# Patient Record
Sex: Male | Born: 1970 | Race: Black or African American | Hispanic: No | Marital: Single | Smoking: Never smoker
Health system: Southern US, Community
[De-identification: ages and names within clinical notes are randomized; demographics above are authoritative.]

## PROBLEM LIST (undated history)

## (undated) DIAGNOSIS — I10 Essential (primary) hypertension: Secondary | ICD-10-CM

---

## 2019-02-15 ENCOUNTER — Other Ambulatory Visit: Payer: Self-pay

## 2019-02-15 ENCOUNTER — Emergency Department (HOSPITAL_COMMUNITY)
Admission: EM | Admit: 2019-02-15 | Discharge: 2019-02-16 | Disposition: A | Discharge status: Home / Self Care | Attending: Emergency Medicine | Admitting: Emergency Medicine

## 2019-02-15 ENCOUNTER — Emergency Department (HOSPITAL_COMMUNITY)

## 2019-02-15 ENCOUNTER — Encounter (HOSPITAL_COMMUNITY): Payer: Self-pay | Admitting: Emergency Medicine

## 2019-02-15 DIAGNOSIS — R079 Chest pain, unspecified: Secondary | ICD-10-CM | POA: Diagnosis present

## 2019-02-15 DIAGNOSIS — Z79899 Other long term (current) drug therapy: Secondary | ICD-10-CM | POA: Insufficient documentation

## 2019-02-15 DIAGNOSIS — I10 Essential (primary) hypertension: Secondary | ICD-10-CM | POA: Insufficient documentation

## 2019-02-15 HISTORY — DX: Essential (primary) hypertension: I10

## 2019-02-15 LAB — CBC
HCT: 51.2 % (ref 39.0–52.0)
Hemoglobin: 17.1 g/dL — ABNORMAL HIGH (ref 13.0–17.0)
MCH: 30 pg (ref 26.0–34.0)
MCHC: 33.4 g/dL (ref 30.0–36.0)
MCV: 89.8 fL (ref 80.0–100.0)
Platelets: 306 10*3/uL (ref 150–400)
RBC: 5.7 MIL/uL (ref 4.22–5.81)
RDW: 12.2 % (ref 11.5–15.5)
WBC: 7.5 10*3/uL (ref 4.0–10.5)
nRBC: 0 % (ref 0.0–0.2)

## 2019-02-15 LAB — TROPONIN I (HIGH SENSITIVITY): Troponin I (High Sensitivity): 7 ng/L (ref <18)

## 2019-02-15 LAB — BASIC METABOLIC PANEL
Anion gap: 11 (ref 5–15)
BUN: 15 mg/dL (ref 6–20)
CO2: 28 mmol/L (ref 22–32)
Calcium: 9.2 mg/dL (ref 8.9–10.3)
Chloride: 97 mmol/L — ABNORMAL LOW (ref 98–111)
Creatinine, Ser: 1.24 mg/dL (ref 0.61–1.24)
GFR calc Af Amer: >60 mL/min (ref >60)
GFR calc non Af Amer: >60 mL/min (ref >60)
Glucose, Bld: 108 mg/dL — ABNORMAL HIGH (ref 70–99)
Potassium: 3.6 mmol/L (ref 3.5–5.1)
Sodium: 136 mmol/L (ref 135–145)

## 2019-02-15 MED ORDER — ACETAMINOPHEN 500 MG PO TABS
1000.0000 mg | ORAL_TABLET | Freq: Once | ORAL | Status: AC
  Administered 2019-02-16: 1000 mg via ORAL
  Filled 2019-02-15: qty 2

## 2019-02-15 MED ORDER — CLONIDINE HCL 0.1 MG PO TABS
0.1000 mg | ORAL_TABLET | Freq: Once | ORAL | Status: AC
  Administered 2019-02-16: 0.1 mg via ORAL
  Filled 2019-02-15: qty 1

## 2019-02-15 NOTE — ED Triage Notes (Signed)
Pt from [facility]. Pt c/o chest pain, HTN, and loss of appetite x 1 week.

## 2019-02-15 NOTE — ED Notes (Signed)
Two attempts for an IV failed. Charge nurse notified.

## 2019-02-16 LAB — TROPONIN I (HIGH SENSITIVITY): Troponin I (High Sensitivity): 7 ng/L (ref <18)

## 2019-02-16 NOTE — Discharge Instructions (Addendum)
Your blood pressure needs to be managed closely by your medical staff.  They might increase the dose of your amlodipine or consider adding Cardizem or a beta-blocker.  If your blood pressure remains difficult to control you may need testing done to make sure you do not have a problem called renal artery stenosis.  That is not done in the emergency department.  You may need referral to a cardiologist if your blood pressure remains difficult to control for advice.  Fortunately your kidney function is good tonight.  You are not having a heart attack.

## 2019-02-16 NOTE — ED Provider Notes (Signed)
[facility] EMERGENCY DEPARTMENT Provider Note   CSN: [id number] Arrival date & time: 02/15/19  2133   Time seen 11:25 PM  History Chief Complaint  Patient presents with  . Chest Pain    Jesse Bryant is a 49 y.o. male.  HPI Patient states he is never been treated for hypertension before.  He states a couple months ago he was started on amlodipine 10 mg and most recently they added HCTZ 25 mg.  He states 2 to 3 weeks ago he started having sharp left lower chest pains that would last 20 to 30 seconds.  They would hurt more with deep breaths and feel better if laying still.  He states sometimes he gets a cramping pain in his chest "locks up, sometimes the pain is elsewhere and he gets "locked up".  He states he has shortness of breath with the pain and he has a chronic cough because he has asthma.  He states he wheezes daily and uses his inhalers.  He denies any fevers.  He states yesterday he felt like his body got overheated and he had nausea and vomiting.  He states he has not eaten well in about 6 days because he has decreased appetite.  He denies diarrhea, sore throat, but states he gets some rhinorrhea when he coughs.  He states he has holocranial headaches daily that he wakes up with and he states they started when his blood pressure started getting high.  He states his blood pressure has been in the 1 80-1 90 range for the past few months.  He states he has been having trouble reading recently.  He currently denies nausea now but states he still has some mild headache.  He states hypertension and diabetes runs in his family.  Patient was started on amoxicillin today by the medical personnel at [facility] prison.  He states he thinks it was for his asthma.  PCP Patient, No Pcp Per     Past Medical History:  Diagnosis Date  . Hypertension     There are no problems to display for this patient.    The histories are not reviewed yet. Please review them in the "History" navigator section  and refresh this SmartLink.     No family history on file.  Social History   Tobacco Use  . Smoking status: Never Smoker  . Smokeless tobacco: Never Used  Substance Use Topics  . Alcohol use: Not Currently  . Drug use: Not Currently  Patient currently incarcerated  Home Medications Prior to Admission medications   Medication Sig Start Date End Date Taking? Authorizing Provider  albuterol (VENTOLIN HFA) 108 (90 Base) MCG/ACT inhaler Inhale 2 puffs into the lungs every 4 (four) hours as needed for wheezing or shortness of breath (As needed for shortness of breath and wheezing).   Yes [provider]  amLODipine (NORVASC) 10 MG tablet Take 10 mg by mouth at bedtime.   Yes [provider]  amoxicillin (AMOXIL) 500 MG capsule Take 500 mg by mouth 3 (three) times daily.   Yes [provider]  guaiFENesin (MUCINEX) 600 MG 12 hr tablet Take by mouth 2 (two) times daily.   Yes [provider]  hydrochlorothiazide (HYDRODIURIL) 25 MG tablet Take 25 mg by mouth daily.   Yes [provider]  mometasone-formoterol (DULERA) 100-5 MCG/ACT AERO Inhale 2 puffs into the lungs 2 (two) times daily.   Yes [provider]    Allergies    Shellfish allergy  Review  of Systems   Review of Systems  All other systems reviewed and are negative.   Physical Exam Updated Vital Signs BP (!) 138/97   Pulse 68   Temp 98.7 F (37.1 C)   Resp 19   Ht 6\' 1"  (1.854 m)   Wt 117.9 kg   SpO2 97%   BMI 34.30 kg/m   Physical Exam Constitutional:      General: He is not in acute distress.    Appearance: Normal appearance. He is obese.  HENT:     Head: Normocephalic and atraumatic.     Right Ear: External ear normal.     Left Ear: External ear normal.  Eyes:     Extraocular Movements: Extraocular movements intact.     Conjunctiva/sclera: Conjunctivae normal.     Pupils: Pupils are equal, round, and reactive to light.  Cardiovascular:     Rate and  Rhythm: Normal rate and regular rhythm.     Pulses: Normal pulses.     Heart sounds: No murmur.  Pulmonary:     Effort: Pulmonary effort is normal.     Breath sounds: Normal breath sounds.     Comments: Patient had a few wheezes at the bases however he coughed and they went away Musculoskeletal:        General: Normal range of motion.     Cervical back: Normal range of motion.     Right lower leg: No edema.     Left lower leg: No edema.  Skin:    General: Skin is warm and dry.  Neurological:     General: No focal deficit present.     Mental Status: He is alert and oriented to person, place, and time.     Cranial Nerves: No cranial nerve deficit.  Psychiatric:        Mood and Affect: Mood normal.        Behavior: Behavior normal.        Thought Content: Thought content normal.     ED Results / Procedures / Treatments   Labs (all labs ordered are listed, but only abnormal results are displayed) Results for orders placed or performed during the hospital encounter of 02/15/19  Basic metabolic panel  Result Value Ref Range   Sodium 136 135 - 145 mmol/L   Potassium 3.6 3.5 - 5.1 mmol/L   Chloride 97 (L) 98 - 111 mmol/L   CO2 28 22 - 32 mmol/L   Glucose, Bld 108 (H) 70 - 99 mg/dL   BUN 15 6 - 20 mg/dL   Creatinine, Ser 04/15/19 0.61 - 1.24 mg/dL   Calcium 9.2 8.9 - 6.28 mg/dL   GFR calc non Af Amer >60 >60 mL/min   GFR calc Af Amer >60 >60 mL/min   Anion gap 11 5 - 15  CBC  Result Value Ref Range   WBC 7.5 4.0 - 10.5 K/uL   RBC 5.70 4.22 - 5.81 MIL/uL   Hemoglobin 17.1 (H) 13.0 - 17.0 g/dL   HCT 31.5 17.6 - 16.0 %   MCV 89.8 80.0 - 100.0 fL   MCH 30.0 26.0 - 34.0 pg   MCHC 33.4 30.0 - 36.0 g/dL   RDW 73.7 10.6 - 26.9 %   Platelets 306 150 - 400 K/uL   nRBC 0.0 0.0 - 0.2 %  Troponin I (High Sensitivity)  Result Value Ref Range   Troponin I (High Sensitivity) 7 <18 ng/L  Troponin I (High Sensitivity)  Result Value Ref Range  Troponin I (High Sensitivity) 7 <18 ng/L    Laboratory interpretation all normal except nonfasting hyperglycemia, negative delta troponin    EKG EKG Interpretation  Date/Time:  Monday February 15 2019 21:51:31 EST Ventricular Rate:  75 PR Interval:    QRS Duration: 105 QT Interval:  399 QTC Calculation: 446 R Axis:   57 Text Interpretation: Sinus rhythm Borderline repolarization abnormality Confirmed by Milton Ferguson [id number]) on 02/15/2019 9:57:02 PM   Radiology DG Chest Portable 1 View  Result Date: 02/15/2019 CLINICAL DATA:  Chest pain, hypertension, anorexia for 1 week EXAM: PORTABLE CHEST 1 VIEW COMPARISON:  None. FINDINGS: The heart size and mediastinal contours are within normal limits. Both lungs are clear. The visualized skeletal structures are unremarkable. IMPRESSION: No active disease. Electronically Signed   By: Randa Ngo M.D.   On: 02/15/2019 22:42    Procedures Procedures (including critical care time)  Medications Ordered in ED Medications  cloNIDine (CATAPRES) tablet 0.1 mg (0.1 mg Oral Given 02/16/19 0017)  acetaminophen (TYLENOL) tablet 1,000 mg (1,000 mg Oral Given 02/16/19 0017)    ED Course  I have reviewed the triage vital signs and the nursing notes.  Pertinent labs & imaging results that were available during my care of the patient were reviewed by me and considered in my medical decision making (see chart for details).    MDM Rules/Calculators/A&P                      Patient was given clonidine orally for his hypertension.  He was given acetaminophen for his complaints of headache.  We discussed his laboratory results, at the time of my exam we only have the first troponin.  Second troponin will be drawn and an hour after I saw the patient.  Patient was given clonidine for his blood pressure without much change.  His delta troponin is negative.  He can be released back to his facility.  He needs them to manage his blood pressure better, he may need a consult with an internist or  cardiologist, he may need testing to see if he has a renal artery stenosis.  At time of discharge his blood pressure did improve to 138/97.  This was approximately 2 hours after the clonidine.  Final Clinical Impression(s) / ED Diagnoses Final diagnoses:  Essential hypertension    Rx / DC Orders ED Discharge Orders    None     Plan discharge  Rolland Porter, MD, Barbette Or, MD 02/16/19 [phone]

## 2019-02-16 NOTE — ED Notes (Signed)
Report given to Darin Engels, Charity fundraiser. Officer provided phone number to nurse to give report before discharge.   Phone number: [phone]

## 2019-06-13 DIAGNOSIS — Y92143 Cell of prison as the place of occurrence of the external cause: Secondary | ICD-10-CM | POA: Insufficient documentation

## 2019-06-13 DIAGNOSIS — Z79899 Other long term (current) drug therapy: Secondary | ICD-10-CM | POA: Diagnosis not present

## 2019-06-13 DIAGNOSIS — Y939 Activity, unspecified: Secondary | ICD-10-CM | POA: Insufficient documentation

## 2019-06-13 DIAGNOSIS — W228XXA Striking against or struck by other objects, initial encounter: Secondary | ICD-10-CM | POA: Insufficient documentation

## 2019-06-13 DIAGNOSIS — I10 Essential (primary) hypertension: Secondary | ICD-10-CM | POA: Diagnosis not present

## 2019-06-13 DIAGNOSIS — Z91013 Allergy to seafood: Secondary | ICD-10-CM | POA: Insufficient documentation

## 2019-06-13 DIAGNOSIS — Y999 Unspecified external cause status: Secondary | ICD-10-CM | POA: Diagnosis not present

## 2019-06-13 DIAGNOSIS — S81011A Laceration without foreign body, right knee, initial encounter: Secondary | ICD-10-CM | POA: Insufficient documentation

## 2019-06-14 ENCOUNTER — Encounter (HOSPITAL_COMMUNITY): Payer: Self-pay | Admitting: Emergency Medicine

## 2019-06-14 ENCOUNTER — Other Ambulatory Visit: Payer: Self-pay

## 2019-06-14 ENCOUNTER — Emergency Department (HOSPITAL_COMMUNITY)
Admission: EM | Admit: 2019-06-14 | Discharge: 2019-06-14 | Attending: Emergency Medicine | Admitting: Emergency Medicine

## 2019-06-14 ENCOUNTER — Emergency Department (HOSPITAL_COMMUNITY)

## 2019-06-14 DIAGNOSIS — S81811A Laceration without foreign body, right lower leg, initial encounter: Secondary | ICD-10-CM

## 2019-06-14 DIAGNOSIS — I10 Essential (primary) hypertension: Secondary | ICD-10-CM

## 2019-06-14 MED ORDER — LIDOCAINE-EPINEPHRINE (PF) 2 %-1:200000 IJ SOLN
10.0000 mL | Freq: Once | INTRAMUSCULAR | Status: AC
  Administered 2019-06-14: 10 mL
  Filled 2019-06-14: qty 10

## 2019-06-14 NOTE — ED Notes (Signed)
Report giving Darin Engels, Charity fundraiser.

## 2019-06-14 NOTE — Discharge Instructions (Signed)
Sutures need to be removed in 10 days.  °

## 2019-06-14 NOTE — ED Provider Notes (Signed)
[facility] EMERGENCY DEPARTMENT Provider Note   CSN: [id number] Arrival date & time: 06/13/19  2340   History Chief Complaint  Patient presents with  . Laceration    Jesse Bryant is a 49 y.o. male.  The history is provided by the patient.  Laceration He has history of hypertension and injured his right knee when he hit it against the side of a bunk.  He suffered a laceration.  Is up-to-date on tetanus immunizations.  He denies other injury.  Past Medical History:  Diagnosis Date  . Hypertension     There are no problems to display for this patient.   History reviewed. No pertinent surgical history.     No family history on file.  Social History   Tobacco Use  . Smoking status: Never Smoker  . Smokeless tobacco: Never Used  Substance Use Topics  . Alcohol use: Not Currently  . Drug use: Not Currently    Home Medications Prior to Admission medications   Medication Sig Start Date End Date Taking? Authorizing Provider  albuterol (VENTOLIN HFA) 108 (90 Base) MCG/ACT inhaler Inhale 2 puffs into the lungs every 4 (four) hours as needed for wheezing or shortness of breath (As needed for shortness of breath and wheezing).    [provider]  amLODipine (NORVASC) 10 MG tablet Take 10 mg by mouth at bedtime.    [provider]  amoxicillin (AMOXIL) 500 MG capsule Take 500 mg by mouth 3 (three) times daily.    [provider]  guaiFENesin (MUCINEX) 600 MG 12 hr tablet Take by mouth 2 (two) times daily.    [provider]  hydrochlorothiazide (HYDRODIURIL) 25 MG tablet Take 25 mg by mouth daily.    [provider]  mometasone-formoterol (DULERA) 100-5 MCG/ACT AERO Inhale 2 puffs into the lungs 2 (two) times daily.    [provider]    Allergies    Shellfish allergy  Review of Systems   Review of Systems  All other systems reviewed and are negative.   Physical Exam Updated Vital Signs BP (!) 141/92   Pulse  60   Temp 98.2 F (36.8 C)   Resp 18   Ht 6\' 1"  (1.854 m)   Wt 117.9 kg   SpO2 100%   BMI 34.30 kg/m   Physical Exam Vitals and nursing note reviewed.   49 year old male, resting comfortably and in no acute distress. Vital signs are significant for mildly elevated blood pressure. Oxygen saturation is 100%, which is normal. Head is normocephalic and atraumatic. PERRLA, EOMI. Oropharynx is clear. Neck is nontender and supple without adenopathy or JVD. Back is nontender and there is no CVA tenderness. Lungs are clear without rales, wheezes, or rhonchi. Chest is nontender. Heart has regular rate and rhythm without murmur. Abdomen is soft, flat, nontender without masses or hepatosplenomegaly and peristalsis is normoactive. Extremities: Laceration is present on the proximal right lower leg just distal to the knee, oriented transversely.  Distal neurovascular exam is intact. Skin is warm and dry without rash. Neurologic: Mental status is normal, cranial nerves are intact, there are no motor or sensory deficits.   ED Results / Procedures / Treatments    Procedures .49Laceration Repair  Date/Time: 06/14/2019 4:23 AM Performed by: 08/14/2019, MD Authorized by: Dione Booze, MD   Consent:    Consent obtained:  Verbal   Consent given by:  Patient   Risks discussed:  Infection and pain   Alternatives discussed:  No treatment  Anesthesia (see MAR for exact dosages):    Anesthesia method:  Local infiltration   Local anesthetic:  Lidocaine 2% WITH epi Laceration details:    Location:  Leg   Leg location:  R lower leg   Length (cm):  8   Depth (mm):  4 Repair type:    Repair type:  Simple Pre-procedure details:    Preparation:  Patient was prepped and draped in usual sterile fashion and imaging obtained to evaluate for foreign bodies Exploration:    Hemostasis achieved with:  Direct pressure   Wound exploration: entire depth of wound probed and visualized     Wound extent: no  fascia violation noted, no foreign bodies/material noted, no tendon damage noted and no underlying fracture noted     Contaminated: no   Treatment:    Area cleansed with:  Saline   Amount of cleaning:  Standard Skin repair:    Repair method:  Sutures   Suture size:  4-0   Suture material:  Prolene   Suture technique:  Running Approximation:    Approximation:  Close Post-procedure details:    Dressing:  Antibiotic ointment and sterile dressing   Patient tolerance of procedure:  Tolerated well, no immediate complications Comments:     Marked tenderness noted during wound exploration, will send for x-ray to rule out underlying fracture.    Imaging Results DG Knee Complete 4 Views Right  Result Date: 06/14/2019 CLINICAL DATA:  Blunt trauma.  Laceration EXAM: RIGHT KNEE - COMPLETE 4+ VIEW COMPARISON:  None. FINDINGS: No evidence of fracture, dislocation, or joint effusion. No opaque foreign body. IMPRESSION: Negative for fracture or opaque foreign body. Electronically Signed   By: Monte Fantasia M.D.   On: 06/14/2019 05:25    Medications Ordered in ED Medications  lidocaine-EPINEPHrine (XYLOCAINE W/EPI) 2 %-1:200000 (PF) injection 10 mL (10 mLs Infiltration Given by Other 06/14/19 1027)    ED Course  I have reviewed the triage vital signs and the nursing notes.  MDM Rules/Calculators/A&P Laceration of the right lower leg, closed with sutures.  Mildly elevated blood pressure.  Old records are reviewed, and he has no relevant past visits.  He is discharged with instructions to have the sutures removed in 7-10 days.  Final Clinical Impression(s) / ED Diagnoses Final diagnoses:  None    Rx / DC Orders ED Discharge Orders    None       Delora Fuel, MD 25/36/64 [phone]

## 2019-06-14 NOTE — ED Triage Notes (Signed)
Pt from [facility]. States he fell and cut his right knee on the corner of the bed. Dressing dry and intact at this time.

## 2019-07-19 ENCOUNTER — Emergency Department (HOSPITAL_COMMUNITY)

## 2019-07-19 ENCOUNTER — Encounter (HOSPITAL_COMMUNITY): Payer: Self-pay | Admitting: Emergency Medicine

## 2019-07-19 ENCOUNTER — Emergency Department (HOSPITAL_COMMUNITY)
Admission: EM | Admit: 2019-07-19 | Discharge: 2019-07-19 | Disposition: A | Discharge status: Home / Self Care | Attending: Emergency Medicine | Admitting: Emergency Medicine

## 2019-07-19 DIAGNOSIS — R0602 Shortness of breath: Secondary | ICD-10-CM | POA: Insufficient documentation

## 2019-07-19 DIAGNOSIS — I10 Essential (primary) hypertension: Secondary | ICD-10-CM | POA: Diagnosis present

## 2019-07-19 LAB — TROPONIN I (HIGH SENSITIVITY): Troponin I (High Sensitivity): 4 ng/L (ref <18)

## 2019-07-19 LAB — BASIC METABOLIC PANEL
Anion gap: 12 (ref 5–15)
BUN: 12 mg/dL (ref 6–20)
CO2: 23 mmol/L (ref 22–32)
Calcium: 9.4 mg/dL (ref 8.9–10.3)
Chloride: 102 mmol/L (ref 98–111)
Creatinine, Ser: 1.21 mg/dL (ref 0.61–1.24)
GFR calc Af Amer: >60 mL/min (ref >60)
GFR calc non Af Amer: >60 mL/min (ref >60)
Glucose, Bld: 113 mg/dL — ABNORMAL HIGH (ref 70–99)
Potassium: 3.8 mmol/L (ref 3.5–5.1)
Sodium: 137 mmol/L (ref 135–145)

## 2019-07-19 LAB — CBC
HCT: 49 % (ref 39.0–52.0)
Hemoglobin: 16.1 g/dL (ref 13.0–17.0)
MCH: 30.2 pg (ref 26.0–34.0)
MCHC: 32.9 g/dL (ref 30.0–36.0)
MCV: 91.9 fL (ref 80.0–100.0)
Platelets: 332 10*3/uL (ref 150–400)
RBC: 5.33 MIL/uL (ref 4.22–5.81)
RDW: 12.9 % (ref 11.5–15.5)
WBC: 6.7 10*3/uL (ref 4.0–10.5)
nRBC: 0 % (ref 0.0–0.2)

## 2019-07-19 NOTE — Discharge Instructions (Signed)
Your labwork and imaging were reassuring today. Your symptoms are unfortunately like related to the lack of AC at the facility. No medical intervention is required today.   Continue taking your blood pressure medication as prescribed.

## 2019-07-19 NOTE — ED Triage Notes (Signed)
States he has high blood pressure has been having trouble breathing and states he feels his hands lock up and he does not feel well.

## 2019-07-19 NOTE — ED Provider Notes (Signed)
[name] EMERGENCY DEPARTMENT Provider Note   CSN: [id number] Arrival date & time: 07/19/19  9622     History No chief complaint on file.   Jesse Bryant is a 49 y.o. male with PMHx HTN who presents to the ED today with CC HTN.  Per triage report patient states he has high blood pressure and has been having trouble breathing states he feels like his hands lock up and he does not feel well.  While in the room patient is mostly focused on his shortness of breath.  [facility] and states he has been there for approximately 3 years.  He states they do not have any air conditioning there and every time 8 gets warm outside he begins feeling short of breath.  He states he has a history of sleep apnea however he denies ever being evaluated for this.  He states when he was at home prior to being in prison he would use his mother's O2 as needed when the air got "hot."  Patient states since summer rolled around he has been having returning symptoms.  He states he needs to be somewhere where they have air conditioning and is here to be evaluated.  Patient does also report he has a history of asthma and has been using his albuterol inhalers when he feels short of breath however this has not been helping him.   Patient also reports that he does have high blood pressure and is currently on medication however he cannot tell me the name.  Per chart review it appears patient is on 10 mg of amlodipine and 25 HCTZ.  He states that he is given this medication every day at the present however they do not check his blood pressure as regularly as he would like.   Patient denies fevers, chills, chest pain, abdominal pain, nausea, vomiting, diarrhea, headache, vision changes, any other associated symptoms.   The history is provided by the patient and medical records.       Past Medical History:  Diagnosis Date  . Hypertension     There are no problems to display for this  patient.   History reviewed. No pertinent surgical history.     No family history on file.  Social History   Tobacco Use  . Smoking status: Never Smoker  . Smokeless tobacco: Never Used  Substance Use Topics  . Alcohol use: Not Currently  . Drug use: Not Currently    Home Medications Prior to Admission medications   Medication Sig Start Date End Date Taking? Authorizing Provider  albuterol (VENTOLIN HFA) 108 (90 Base) MCG/ACT inhaler Inhale 2 puffs into the lungs every 4 (four) hours as needed for wheezing or shortness of breath (As needed for shortness of breath and wheezing).    [provider]  amLODipine (NORVASC) 10 MG tablet Take 10 mg by mouth at bedtime.    [provider]  amoxicillin (AMOXIL) 500 MG capsule Take 500 mg by mouth 3 (three) times daily.    [provider]  guaiFENesin (MUCINEX) 600 MG 12 hr tablet Take by mouth 2 (two) times daily.    [provider]  hydrochlorothiazide (HYDRODIURIL) 25 MG tablet Take 25 mg by mouth daily.    [provider]  mometasone-formoterol (DULERA) 100-5 MCG/ACT AERO Inhale 2 puffs into the lungs 2 (two) times daily.    [provider]    Allergies    Shellfish allergy  Review of Systems  Review of Systems  Constitutional: Negative for chills and fever.  Eyes: Negative for visual disturbance.  Respiratory: Positive for shortness of breath. Negative for cough.   Cardiovascular: Negative for chest pain.  Gastrointestinal: Negative for abdominal pain, nausea and vomiting.  Neurological: Negative for headaches.  All other systems reviewed and are negative.   Physical Exam Updated Vital Signs BP (!) 154/104   Pulse (!) 55   Temp 98.3 F (36.8 C) (Oral)   Resp 18   Ht 6\' 1"  (1.854 m)   Wt 115.7 kg   SpO2 99%   BMI 33.64 kg/m   Physical Exam Vitals and nursing note reviewed.  Constitutional:      Appearance: He is not ill-appearing or diaphoretic.      Comments: Standing upright coughing in a corner when I enter the room  HENT:     Head: Normocephalic and atraumatic.  Eyes:     Conjunctiva/sclera: Conjunctivae normal.  Cardiovascular:     Rate and Rhythm: Normal rate and regular rhythm.  Pulmonary:     Effort: Pulmonary effort is normal.     Breath sounds: Normal breath sounds. No wheezing, rhonchi or rales.     Comments: Speaking rapidly in complete sentences. No accessory muscle use. Satting 99% on RA. LCTAB.  Abdominal:     Palpations: Abdomen is soft.     Tenderness: There is no abdominal tenderness. There is no guarding or rebound.  Musculoskeletal:     Cervical back: Neck supple.  Skin:    General: Skin is warm and dry.  Neurological:     Mental Status: He is alert.  Psychiatric:     Comments: Pressured speech     ED Results / Procedures / Treatments   Labs (all labs ordered are listed, but only abnormal results are displayed) Labs Reviewed  BASIC METABOLIC PANEL - Abnormal; Notable for the following components:      Result Value   Glucose, Bld 113 (*)    All other components within normal limits  CBC  TROPONIN I (HIGH SENSITIVITY)  TROPONIN I (HIGH SENSITIVITY)    EKG EKG Interpretation  Date/Time:  Monday July 19 2019 10:24:35 EDT Ventricular Rate:  70 PR Interval:  156 QRS Duration: 88 QT Interval:  408 QTC Calculation: 440 R Axis:   61 Text Interpretation: Normal sinus rhythm T wave abnormality, consider inferior ischemia Abnormal ECG similar to prior tracing from 02/15/19 Confirmed by 04/15/19 [id number]) on 07/19/2019 12:14:49 PM   Radiology DG Chest 2 View  Result Date: 07/19/2019 CLINICAL DATA:  Chest pressure. Additional provided: High blood pressure, difficulty breathing, history of asthma EXAM: CHEST - 2 VIEW COMPARISON:  Chest radiograph 02/15/2019 FINDINGS: Heart size within normal limits. There is no appreciable airspace consolidation. No evidence of pleural effusion or pneumothorax. No acute  bony abnormality identified. IMPRESSION: No evidence of active cardiopulmonary disease. Electronically Signed   By: 04/15/2019 DO   On: 07/19/2019 10:43    Procedures Procedures (including critical care time)  Medications Ordered in ED Medications - No data to display  ED Course  I have reviewed the triage vital signs and the nursing notes.  Pertinent labs & imaging results that were available during my care of the patient were reviewed by me and considered in my medical decision making (see chart for details).    MDM Rules/Calculators/A&P  49 year old male who resides at the present can presenting to the ED today complaining of shortness of breath that occurs whenever summer comes as there is no AC at the facility.  He also complains of high blood pressure.  He has history of same and is currently on amlodipine and HCTZ.  As I enter the room patient is standing in a corner coughing sporadically.  He then comes to sit down on the bed and does not continue to cough.  Patient is noted to have pressured speech.  He is speaking very rapidly in complete sentences and there is no increased work of breathing or accessory muscle use noted.  Patient's lungs are clear to auscultation bilaterally.  He is satting 99% on room air.  He is afebrile, nontachycardic nontachypneic.  He appears to be in no acute distress at this time.  He does have albuterol inhaler with him as he reports history of asthma.  Blood pressure 154/104 on arrival.  Lab work was obtained while patient was in the waiting room including EKG, chest x-ray, work-up for ACS however patient denies any chest pain to me.   Chest x-ray clear. CBC without leukocytosis.  Hemoglobin stable at 16.1. BMP with mild elevation in glucose at 113. No other findings.  Troponin of 4. Pt without specific complaints of CP. I do not feel he needs a repeat troponin at this time.   Pt ambulated in the ED with O2 sats remaining at  99%.  Orthostatics within normal limits.   Will discharge at this time. Pt continues to express he will feel short of breath during the summer months due to lack of AC at his facility. I do not feel there is any need for further emergent workup at this time.   This note was prepared using Dragon voice recognition software and may include unintentional dictation errors due to the inherent limitations of voice recognition software.  Final Clinical Impression(s) / ED Diagnoses Final diagnoses:  Mild shortness of breath    Rx / DC Orders ED Discharge Orders    None       Discharge Instructions     Your labwork and imaging were reassuring today. Your symptoms are unfortunately like related to the lack of AC at the facility. No medical intervention is required today.   Continue taking your blood pressure medication as prescribed.        Tanda Rockers, PA-C 07/19/19 1328    Raeford Razor, MD 07/25/19 1126

## 2019-07-19 NOTE — ED Notes (Signed)
Pulse ox 99=100 while ambulating

## 2021-02-09 IMAGING — DX DG CHEST 1V PORT
1 series · 1 of 1 positions shown · non-contrast
Comparison: None.

CLINICAL DATA: Chest pain, hypertension, anorexia for 1 week

EXAM:
PORTABLE CHEST 1 VIEW

[chest ap grid]
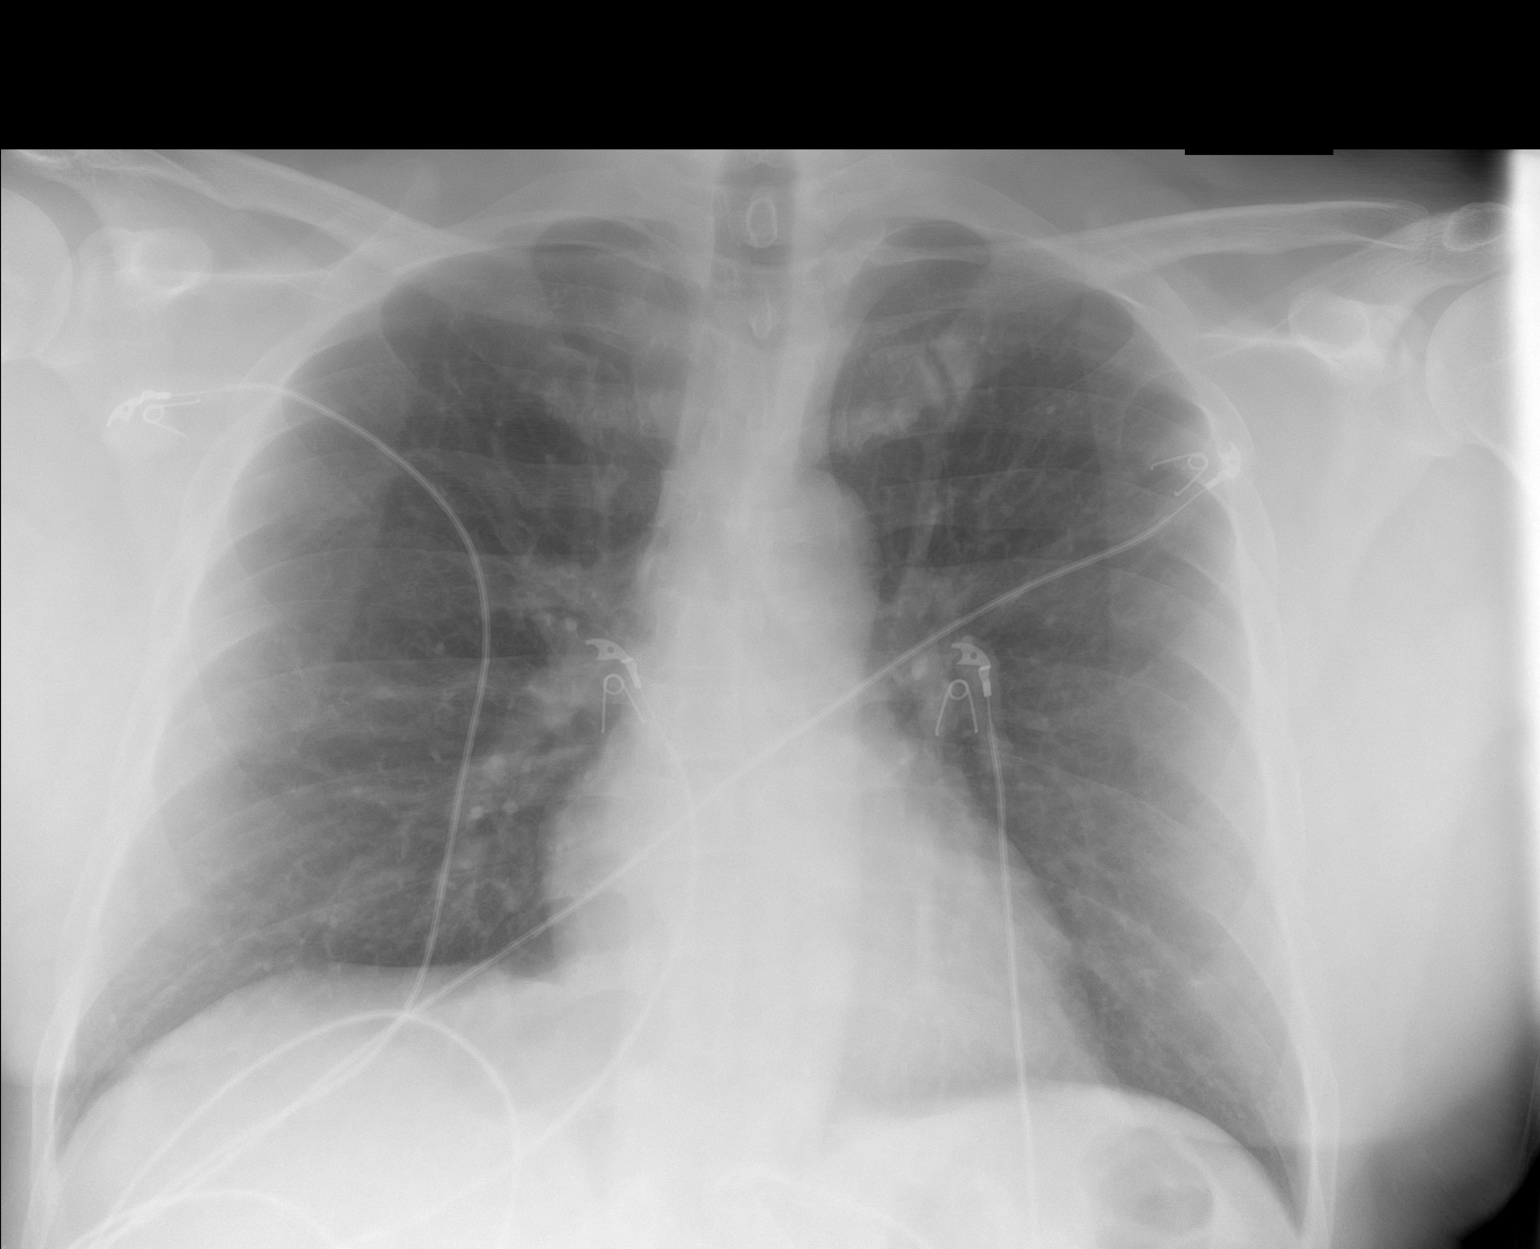

[1 of 1 positions shown; findings below may reference images not displayed]

FINDINGS: The heart size and mediastinal contours are within normal limits.
Both lungs are clear. The visualized skeletal structures are
unremarkable.
IMPRESSION: No active disease.

## 2021-06-08 IMAGING — DX DG KNEE COMPLETE 4+V*R*
4 series · 4 of 4 positions shown · non-contrast
Comparison: None.

CLINICAL DATA: Blunt trauma.  Laceration

EXAM:
RIGHT KNEE - COMPLETE 4+ VIEW

[knee ap]
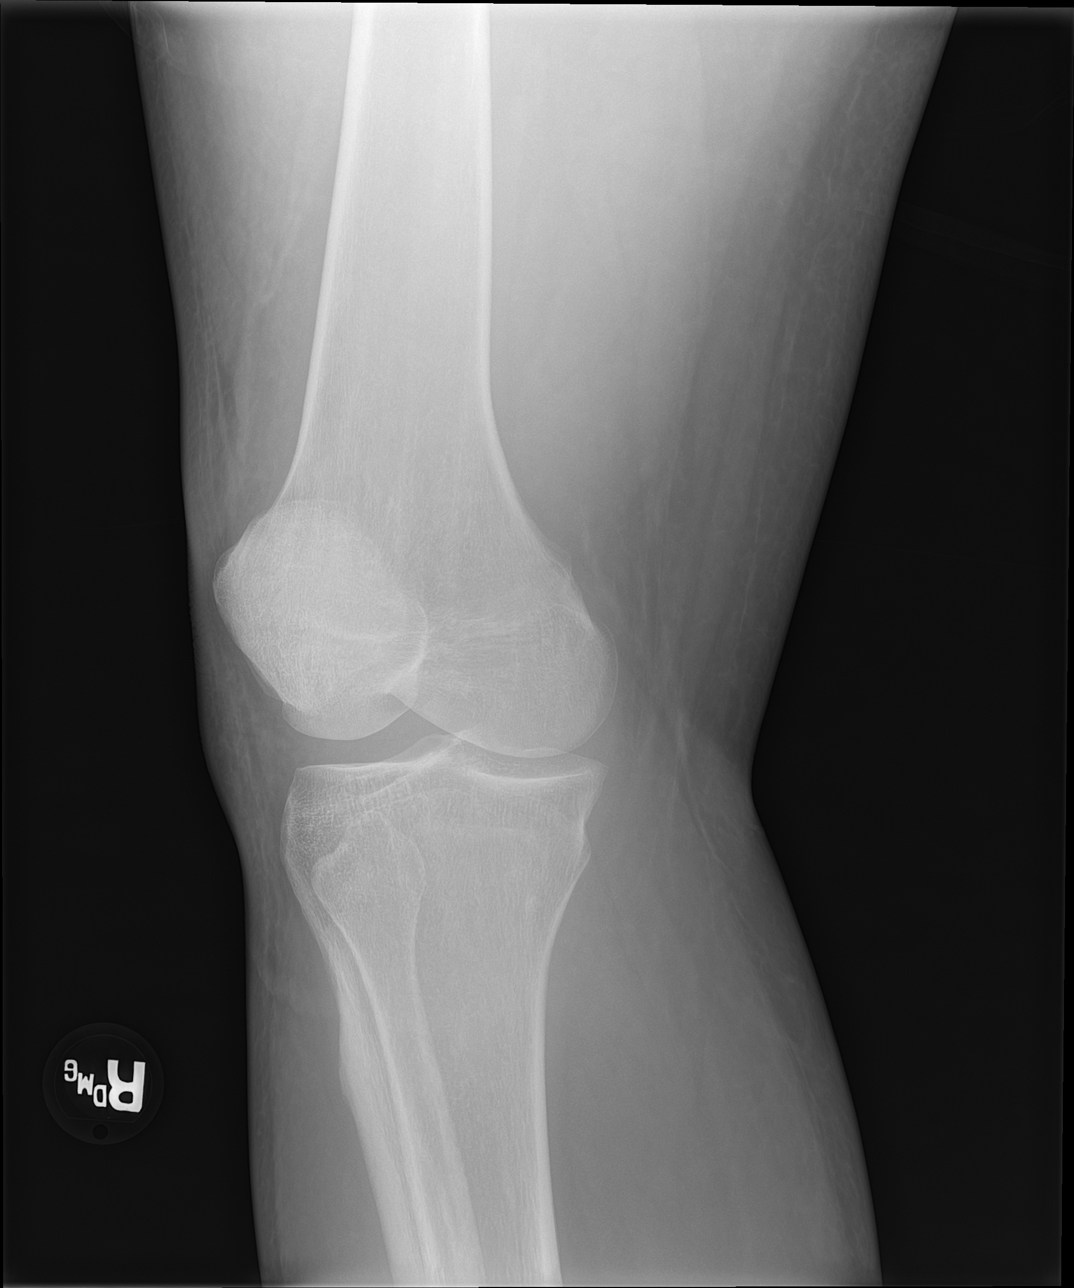

[knee lat]
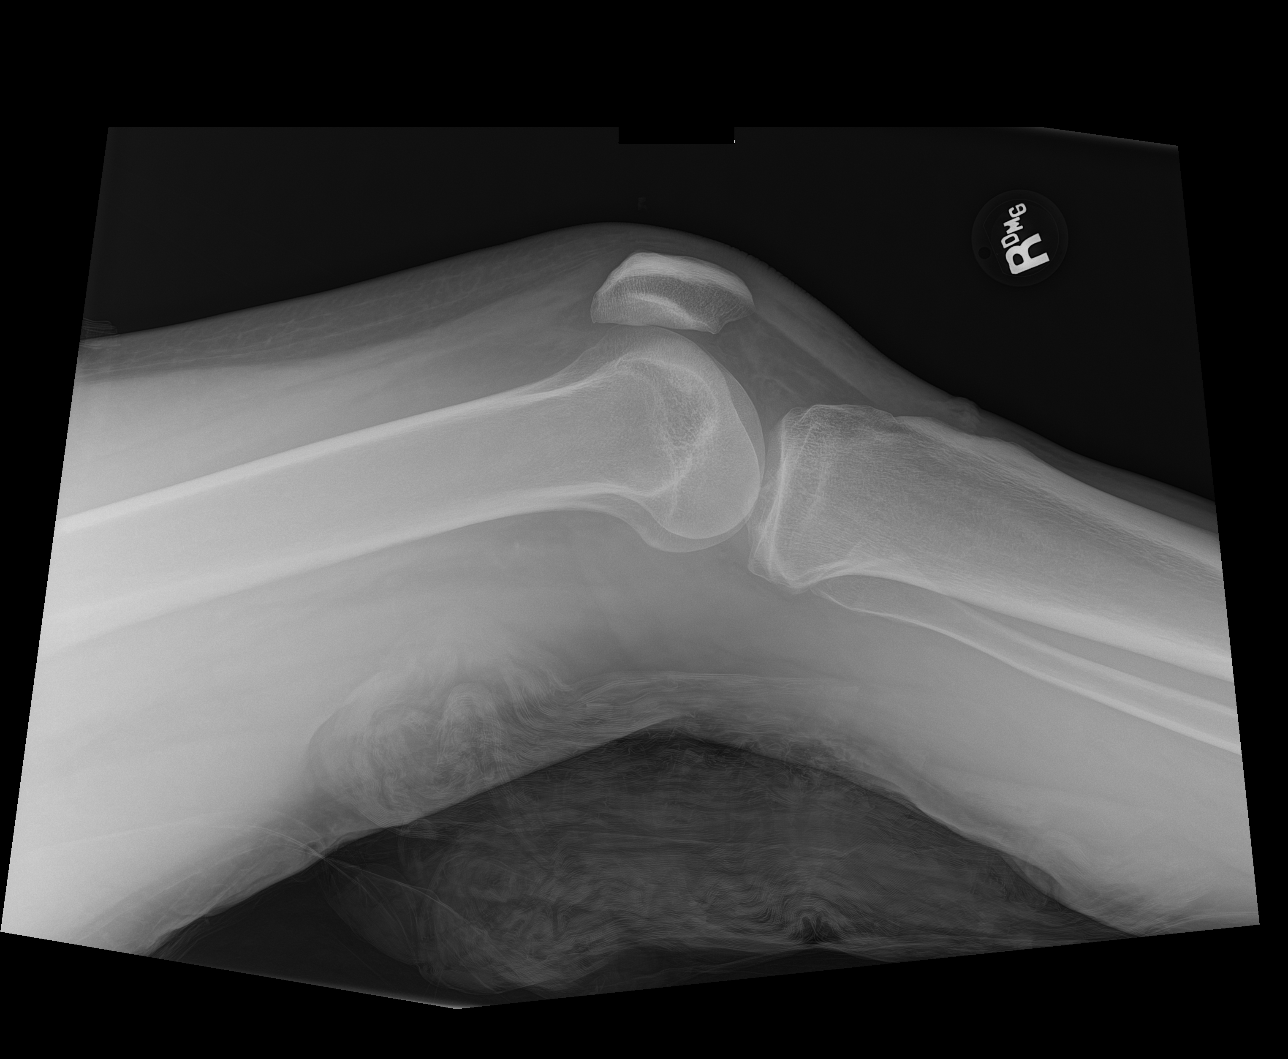

[knee obl (1 of 2)]
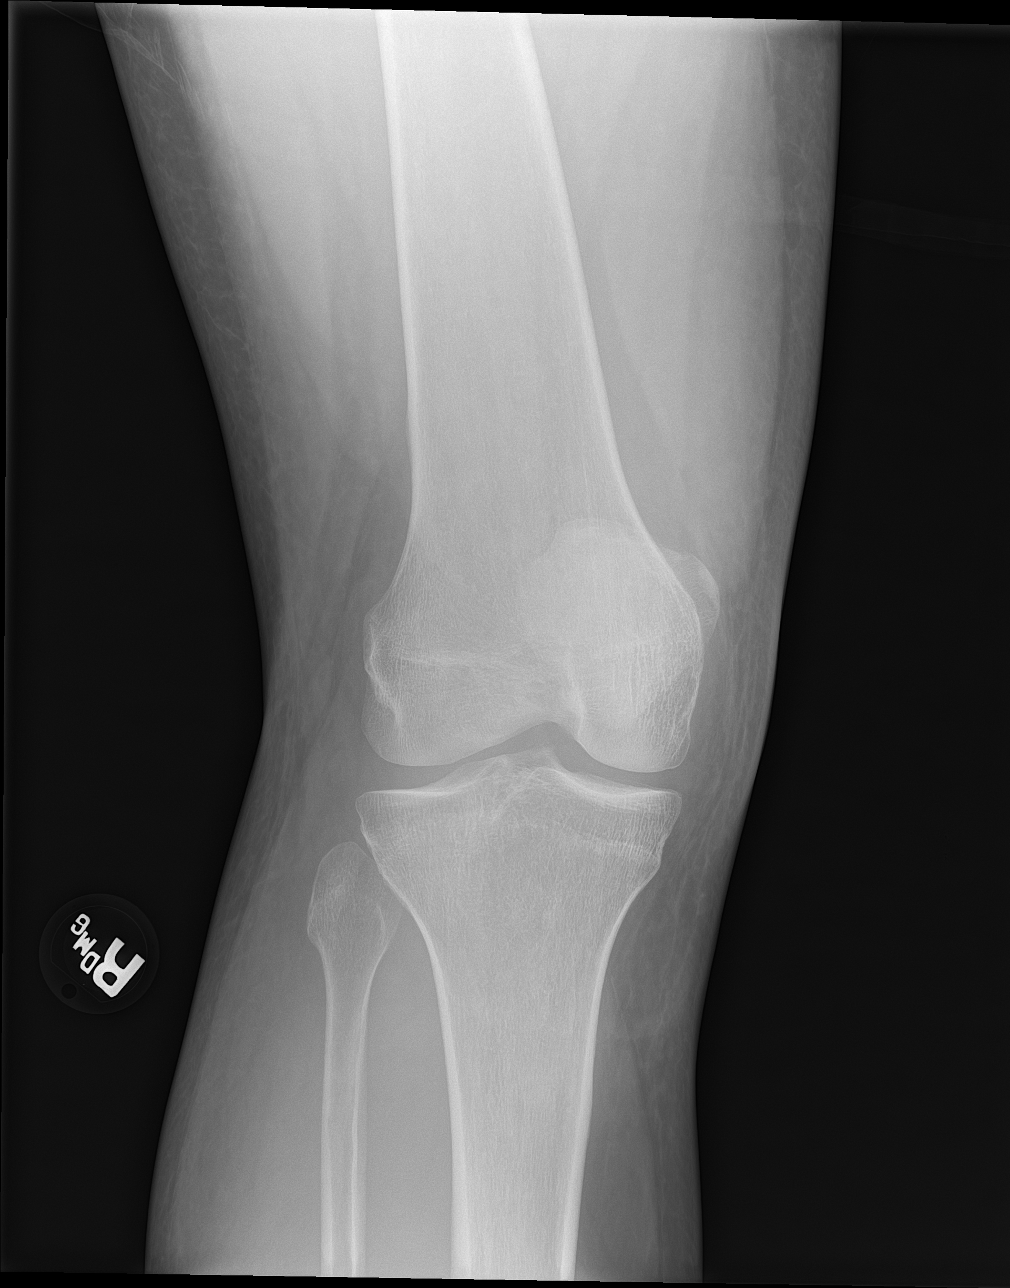

[knee obl (2 of 2)]
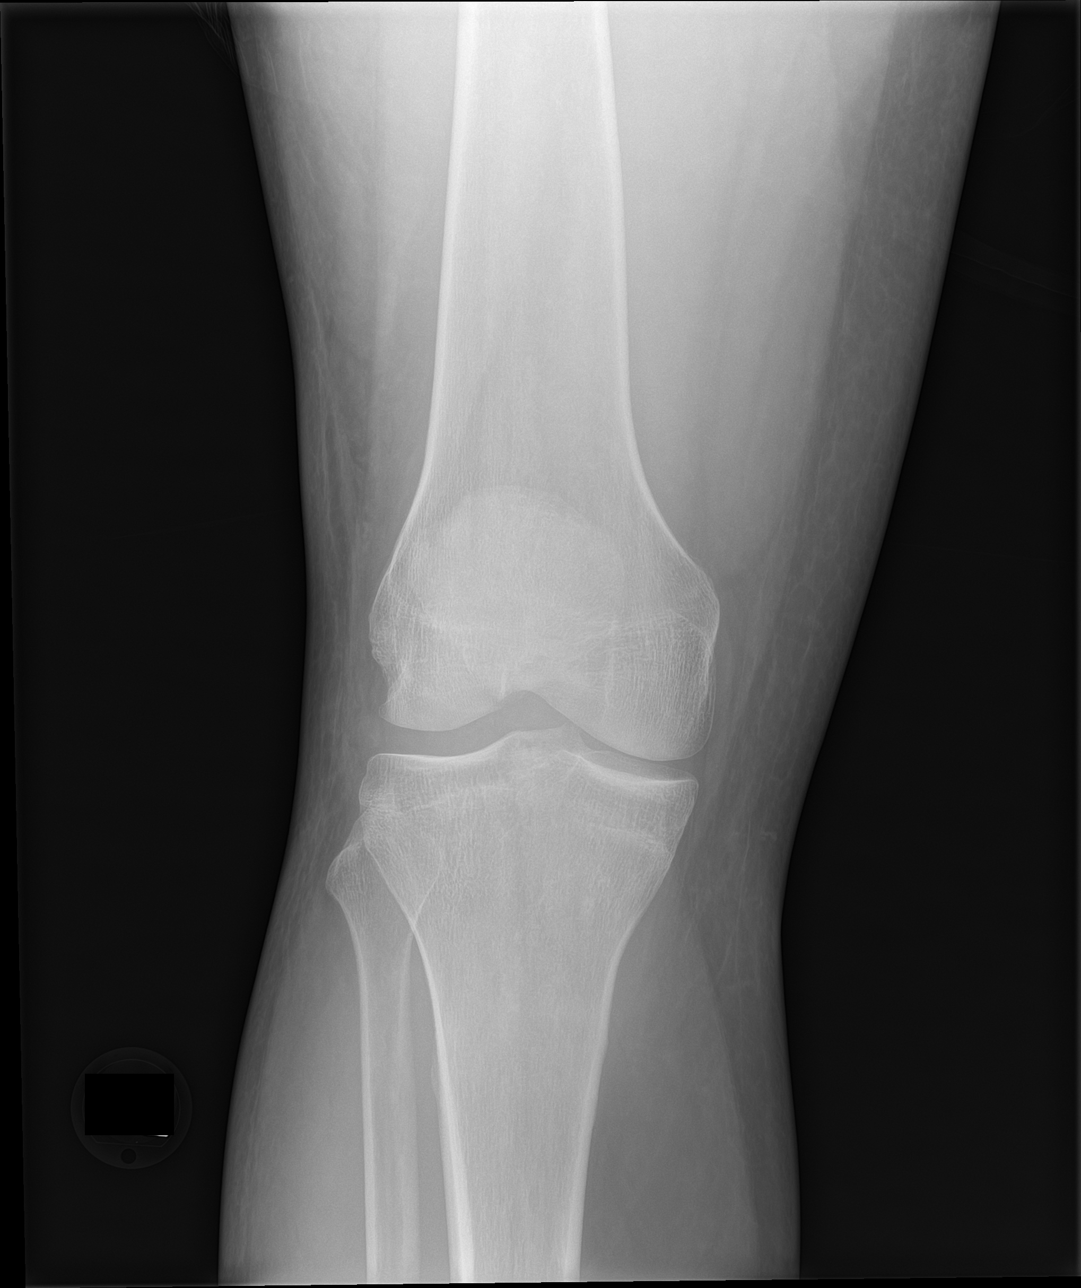

[4 of 4 positions shown; findings below may reference images not displayed]

FINDINGS: No evidence of fracture, dislocation, or joint effusion. No opaque
foreign body.
IMPRESSION: Negative for fracture or opaque foreign body.

## 2021-07-13 IMAGING — DX DG CHEST 2V
2 series · 2 of 2 positions shown · non-contrast
Comparison: Chest radiograph 02/15/2019

CLINICAL DATA: Chest pressure. Additional provided: High blood
pressure, difficulty breathing, history of asthma

EXAM:
CHEST - 2 VIEW

[chest pa]
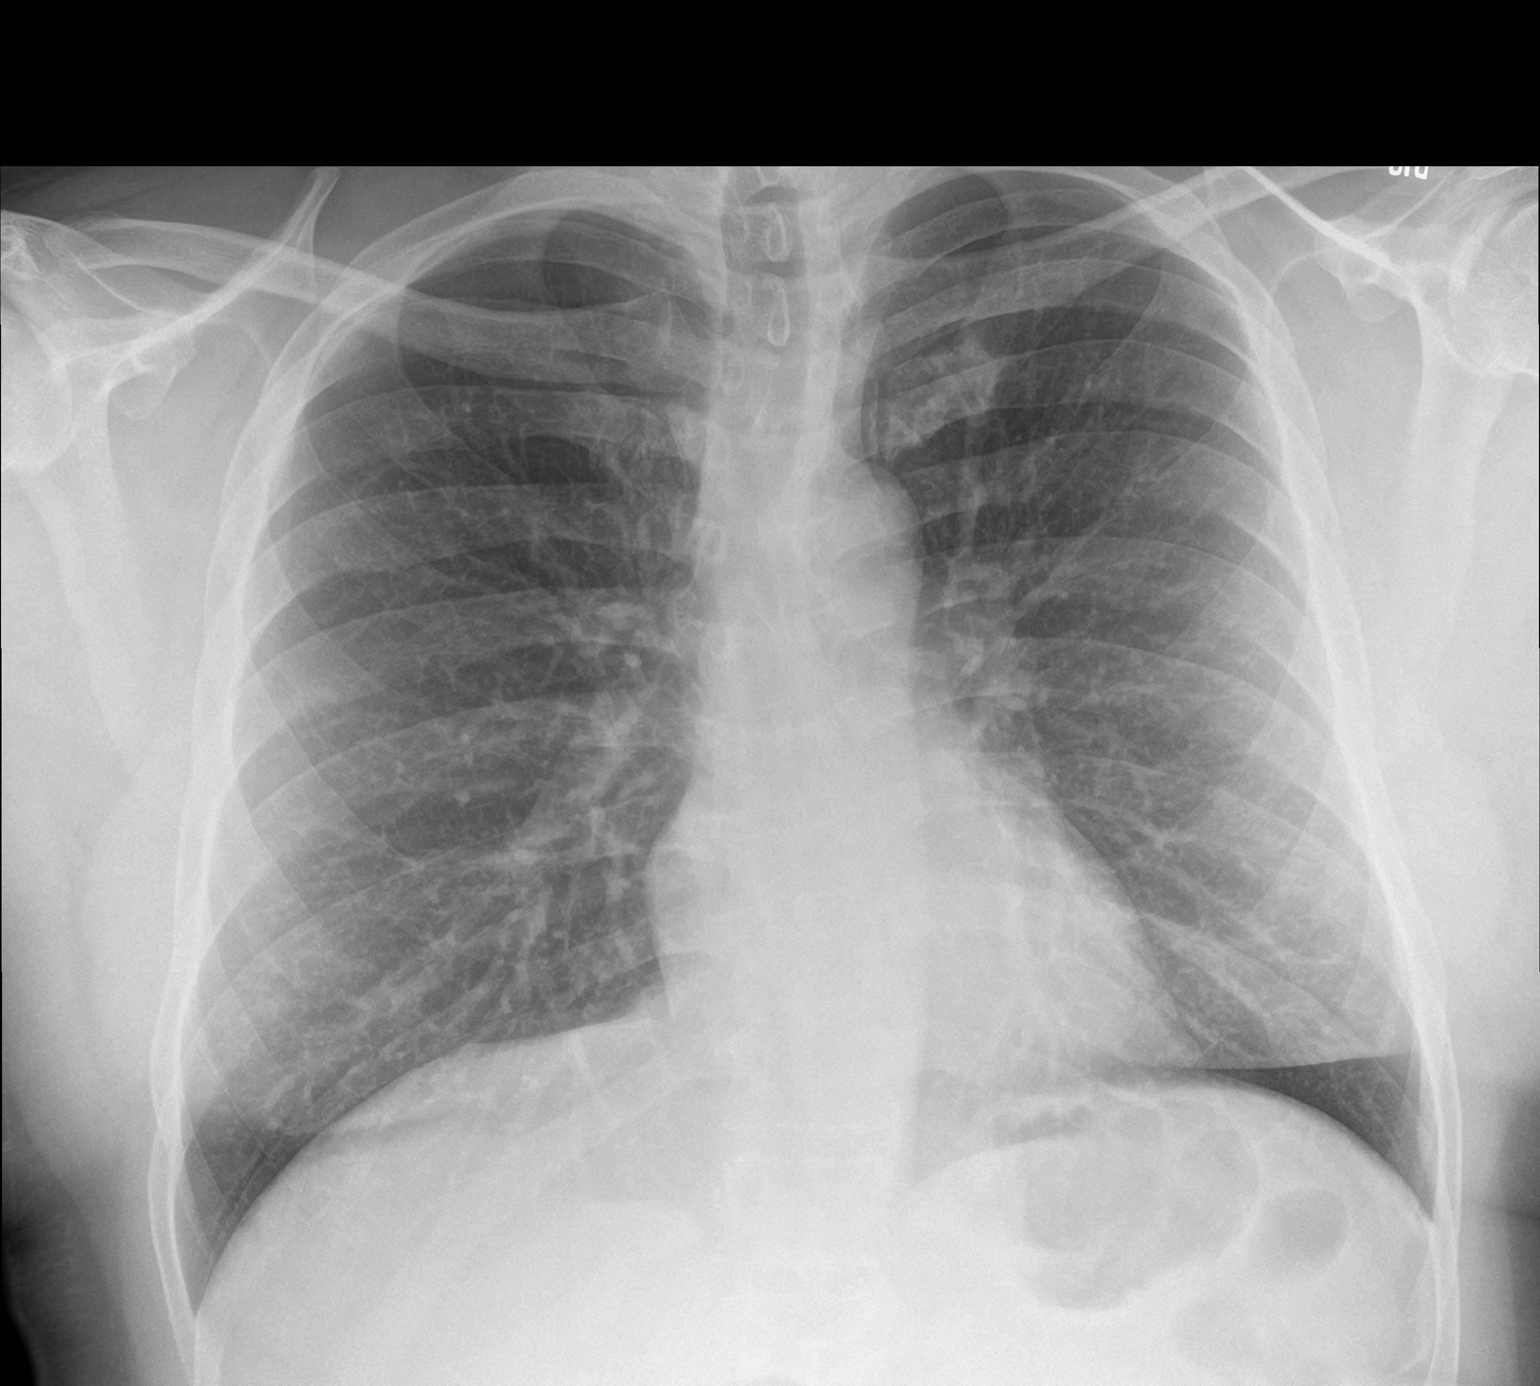

[chest lat]
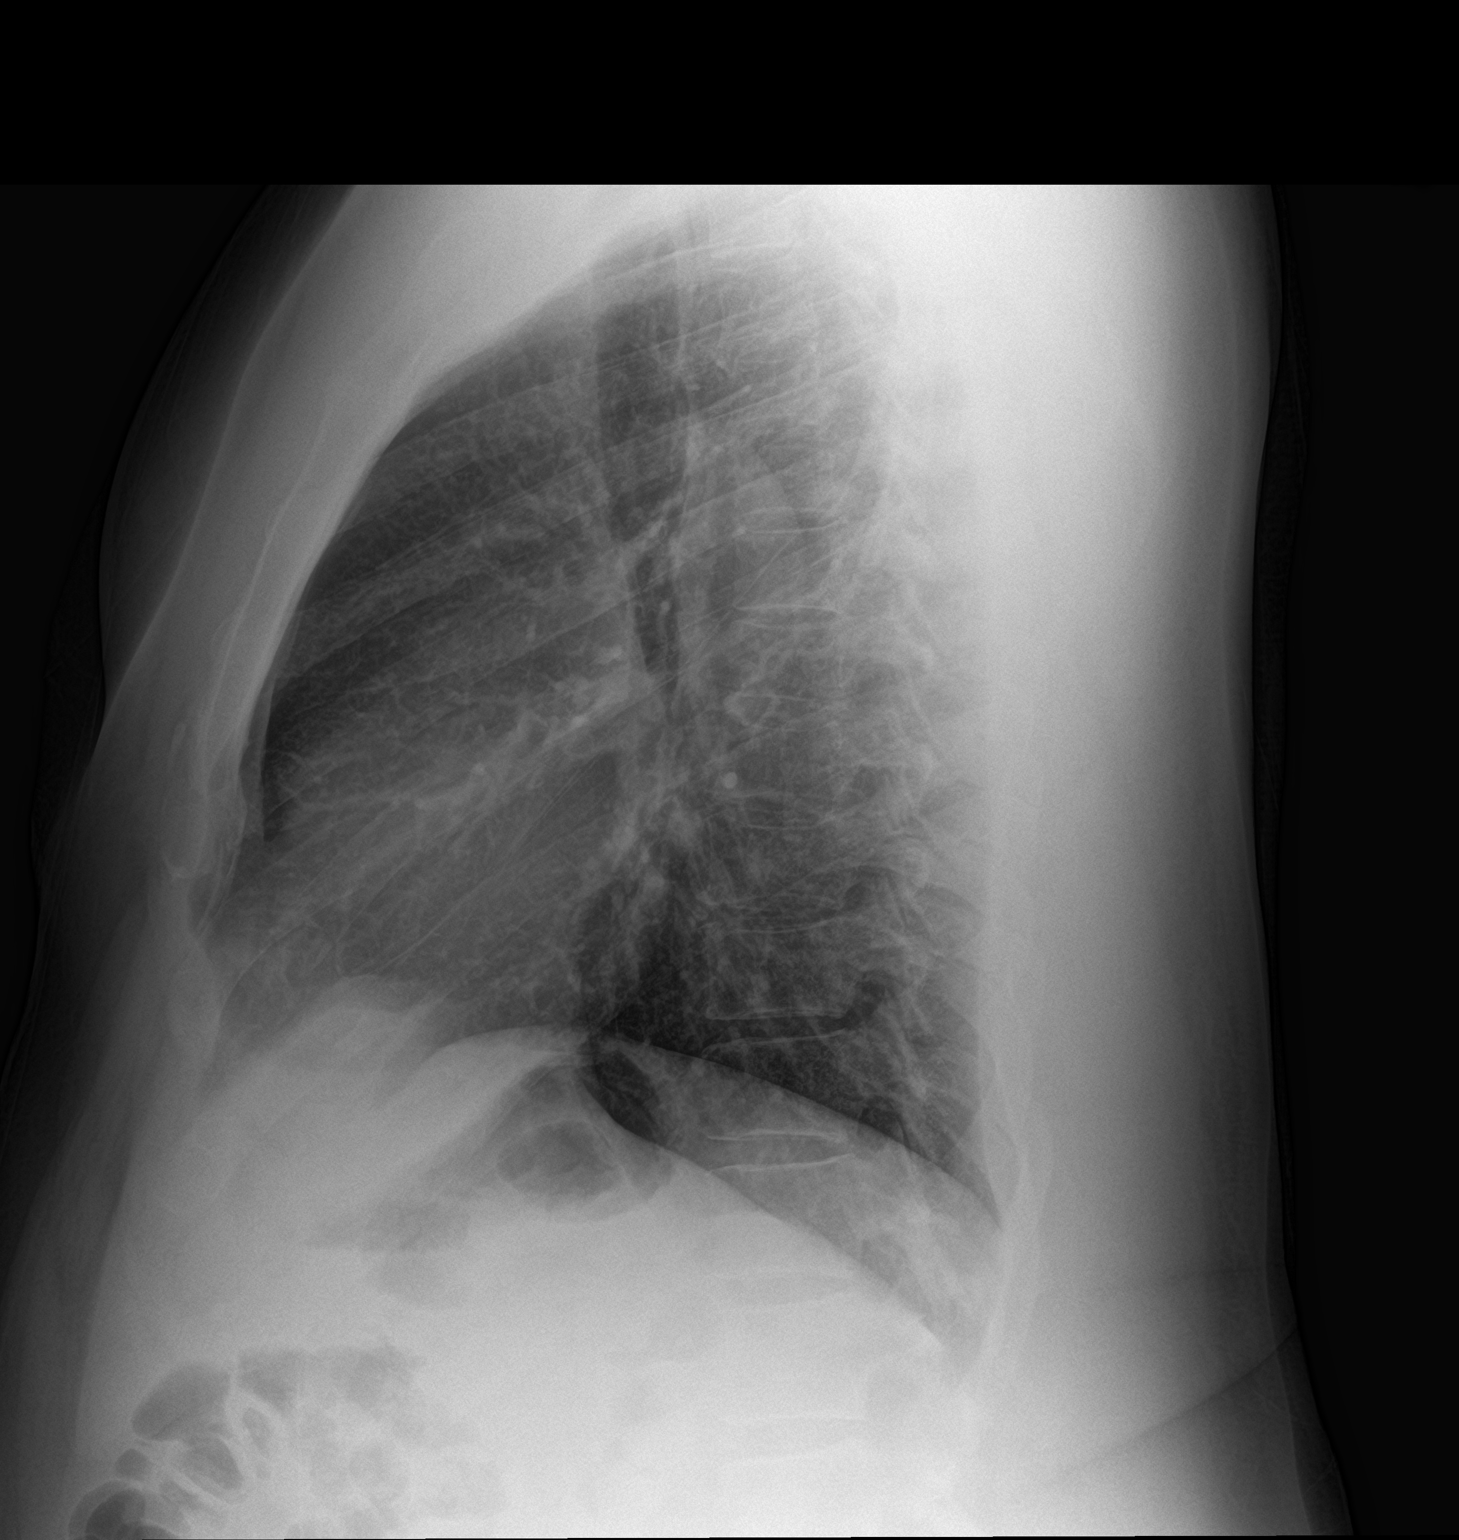

[2 of 2 positions shown; findings below may reference images not displayed]

FINDINGS: Heart size within normal limits.

There is no appreciable airspace consolidation.

No evidence of pleural effusion or pneumothorax.

No acute bony abnormality identified.
IMPRESSION: No evidence of active cardiopulmonary disease.
